# Patient Record
Sex: Female | Born: 1995 | Race: White | Hispanic: No | Marital: Single | Smoking: Never smoker
Health system: Southern US, Community
[De-identification: ages and names within clinical notes are randomized; demographics above are authoritative.]

---
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
## 2020-11-10 ENCOUNTER — Encounter (HOSPITAL_COMMUNITY): Payer: Self-pay | Admitting: Emergency Medicine

## 2020-11-10 ENCOUNTER — Emergency Department (HOSPITAL_COMMUNITY)
Admission: EM | Admit: 2020-11-10 | Discharge: 2020-11-10 | Disposition: A | Discharge status: Home / Self Care | Payer: Medicaid Other | Attending: Emergency Medicine | Admitting: Emergency Medicine

## 2020-11-10 ENCOUNTER — Other Ambulatory Visit: Payer: Self-pay

## 2020-11-10 ENCOUNTER — Emergency Department (HOSPITAL_COMMUNITY): Payer: Medicaid Other

## 2020-11-10 DIAGNOSIS — Y9389 Activity, other specified: Secondary | ICD-10-CM | POA: Diagnosis not present

## 2020-11-10 DIAGNOSIS — W548XXA Other contact with dog, initial encounter: Secondary | ICD-10-CM | POA: Insufficient documentation

## 2020-11-10 DIAGNOSIS — M79644 Pain in right finger(s): Secondary | ICD-10-CM

## 2020-11-10 DIAGNOSIS — S60931A Unspecified superficial injury of right thumb, initial encounter: Secondary | ICD-10-CM | POA: Insufficient documentation

## 2020-11-10 NOTE — ED Provider Notes (Signed)
[facility]-EMERGENCY DEPT Provider Note   CSN: [id number] Arrival date & time: 11/10/20  1357     History Chief Complaint  Patient presents with  . Hand Injury    Jonet Mathies is a 25 y.o. female with no reported past pertinent medical history.  Patient presents with chief complaint of right thumb pain.  Pain began last night after playing with her dog.  Patient reports that while playing with her dog her right thumb was "pushed back."  Patient reports that is constant, has been progressively worsening,8/10 on the pain scale.  Pain was improved with ice and is worse with movement.  Patient also endorses swelling to her right thumb.  Patient is right-hand dominant.  Patient denies any numbness or tingling to the right hand or digits.  Patient denies any bites from her dog.  HPI     History reviewed. No pertinent past medical history.  There are no problems to display for this patient.   History reviewed. No pertinent surgical history.   OB History   No obstetric history on file.     No family history on file.  Social History   Tobacco Use  . Smoking status: Never Smoker  . Smokeless tobacco: Never Used  Substance Use Topics  . Alcohol use: No  . Drug use: No    Home Medications Prior to Admission medications   Medication Sig Start Date End Date Taking? Authorizing Provider  ondansetron (ZOFRAN ODT) 4 MG disintegrating tablet 4mg  ODT q6 hours prn nausea/vomit 09/04/16   14/8/17, MD    Allergies    Patient has no known allergies.  Review of Systems   Review of Systems  Musculoskeletal: Positive for arthralgias and joint swelling. Negative for back pain and neck pain.  Skin: Negative for color change and wound.  Neurological: Negative for weakness and numbness.    Physical Exam Updated Vital Signs BP 128/79   Pulse 79   Temp 98.9 F (37.2 C)   Resp 17   LMP 11/05/2020   SpO2 97%   Physical Exam Vitals and nursing  note reviewed.  Constitutional:      General: She is not in acute distress.    Appearance: She is not ill-appearing, toxic-appearing or diaphoretic.  HENT:     Head: Normocephalic.  Eyes:     General: No scleral icterus.       Right eye: No discharge.        Left eye: No discharge.  Cardiovascular:     Rate and Rhythm: Normal rate.  Pulmonary:     Effort: Pulmonary effort is normal. No respiratory distress.  Musculoskeletal:     Right shoulder: Normal.     Right upper arm: Normal.     Right elbow: Normal.     Right forearm: Normal.     Right wrist: Normal.     Left wrist: Normal.     Right hand: Swelling (r thumb), tenderness (r thumb) and bony tenderness (r thumb) present. No deformity or lacerations. Normal range of motion. Normal strength. Normal sensation. Normal capillary refill.     Left hand: No swelling, deformity, lacerations, tenderness or bony tenderness. Normal range of motion. Normal strength. Normal sensation. Normal capillary refill.     Comments: Patient has sensation and ROM intact to all digits of right hand  Some laxity is noted to Right MCP joint   Skin:    General: Skin is warm and dry.     Comments: No  erythema, open wounds, or lacerations noted to right hand or wrist  Neurological:     General: No focal deficit present.     Mental Status: She is alert.  Psychiatric:        Behavior: Behavior is cooperative.     ED Results / Procedures / Treatments   Labs (all labs ordered are listed, but only abnormal results are displayed) Labs Reviewed - No data to display  EKG None  Radiology DG Wrist Complete Right  Result Date: 11/10/2020 CLINICAL DATA:  Right thumb hyperextension injury last night. Pain radiates inter forearm. EXAM: RIGHT HAND - COMPLETE 3+ VIEW; RIGHT THUMB 2+V; RIGHT WRIST - COMPLETE 3+ VIEW COMPARISON:  None. FINDINGS: There is no evidence of fracture or dislocation. There is no evidence of arthropathy or other focal bone abnormality.  Soft tissues are unremarkable. IMPRESSION: Negative. Electronically Signed   By: Obie Dredge M.D.   On: 11/10/2020 14:57   DG Hand Complete Right  Result Date: 11/10/2020 CLINICAL DATA:  Right thumb hyperextension injury last night. Pain radiates inter forearm. EXAM: RIGHT HAND - COMPLETE 3+ VIEW; RIGHT THUMB 2+V; RIGHT WRIST - COMPLETE 3+ VIEW COMPARISON:  None. FINDINGS: There is no evidence of fracture or dislocation. There is no evidence of arthropathy or other focal bone abnormality. Soft tissues are unremarkable. IMPRESSION: Negative. Electronically Signed   By: Obie Dredge M.D.   On: 11/10/2020 14:57   DG Finger Thumb Right  Result Date: 11/10/2020 CLINICAL DATA:  Right thumb hyperextension injury last night. Pain radiates inter forearm. EXAM: RIGHT HAND - COMPLETE 3+ VIEW; RIGHT THUMB 2+V; RIGHT WRIST - COMPLETE 3+ VIEW COMPARISON:  None. FINDINGS: There is no evidence of fracture or dislocation. There is no evidence of arthropathy or other focal bone abnormality. Soft tissues are unremarkable. IMPRESSION: Negative. Electronically Signed   By: Obie Dredge M.D.   On: 11/10/2020 14:57    Procedures Procedures   Medications Ordered in ED Medications - No data to display  ED Course  I have reviewed the triage vital signs and the nursing notes.  Pertinent labs & imaging results that were available during my care of the patient were reviewed by me and considered in my medical decision making (see chart for details).    MDM Rules/Calculators/A&P                          Alert 25 year old female in no acute distress, nontoxic appearing.  Patient presents with chief complaint of right thumb pain after injury laying with dog.  Patient reports that her son was bent backwards.  Patient is right-hand dominant.  Patient has no numbness, tingling, to her right hand or digits.  On physical exam patient has tenderness to entire length of the right thumb.  Minimal swelling noted to  right thumb.  Patient has laxity noted to right MCP joint.  Patient +3 radial pulse to right wrist.  Cap refill less than 2 in all digits of right hand.  Sensation intact to all digits of right hand.  Patient able to flex and extend all digits of right hand.    Patient was offered opiate pain medication but refused because she did not want to have any associated sedation.  Patient is tolerating her pain with no obvious discomfort at this time.  X-ray of right thumb, right hand, and right wrist obtained.  X-ray showed no fracture or dislocation.  With lacxity to right MCP concern for gamekeeper's  thumb injury.  Will place patient in a removable spica splint and have her follow-up with hand specialist Dr. Eulah Pont.  Advised patient to use OTC pain medications, keep hand elevated and to ice affected hand.  Discussed results, findings, treatment and follow up. Patient advised of return precautions. Patient verbalized understanding and agreed with plan.     Final Clinical Impression(s) / ED Diagnoses Final diagnoses:  Pain of right thumb    Rx / DC Orders ED Discharge Orders    None       Berneice Heinrich 11/10/20 1535    Derwood Kaplan, MD 11/10/20 1601

## 2020-11-10 NOTE — Progress Notes (Signed)
Orthopedic Tech Progress Note Patient Details:  Melanie Downs December 01, 1995 [id number]  Ortho Devices Ortho Device/Splint Location: applied velcro thumb spica to RUE Ortho Device/Splint Interventions: Ordered,Application   Post Interventions Patient Tolerated: Well Instructions Provided: Care of device   Jennye Moccasin 11/10/2020, 3:25 PM

## 2020-11-10 NOTE — ED Triage Notes (Signed)
Patient c/o right thumb pain after injury playing with dog today.

## 2020-11-10 NOTE — Discharge Instructions (Addendum)
You came to the emergency department today to be evaluated for your right thumb pain.  Your x-rays showed no bony mass or dislocations.  Based on your physical exam and that there is some laxity to your right thumb.  Due to this I have splint for your right hand and we will have you follow-up with the hand specialist.  Please keep your hand elevated and ice the hand to reduce swelling.    Please take Ibuprofen (Advil, motrin) and Tylenol (acetaminophen) to relieve your pain.    You may take up to 600 MG (3 pills) of normal strength ibuprofen every 8 hours as needed.   You make take tylenol, up to 1,000 mg (two extra strength pills) every 8 hours as needed.   It is safe to take ibuprofen and tylenol at the same time as they work differently.   Do not take more than 3,000 mg tylenol in a 24 hour period (not more than one dose every 8 hours.  Please check all medication labels as many medications such as pain and cold medications may contain tylenol.  Do not drink alcohol while taking these medications.  Do not take other NSAID'S while taking ibuprofen (such as aleve or naproxen).  Please take ibuprofen with food to decrease stomach upset.  Get help right away if: You have pain that gets worse. You have tingling and numbness. You have changes in skin color, including paleness or a bluish color.

## 2022-09-12 IMAGING — CR DG WRIST COMPLETE 3+V*R*
4 series · 4 of 4 positions shown · non-contrast
Comparison: None.

CLINICAL DATA: Right thumb hyperextension injury last night. Pain
radiates inter forearm.

EXAM:
RIGHT HAND - COMPLETE 3+ VIEW; RIGHT THUMB 2+V; RIGHT WRIST -
COMPLETE 3+ VIEW

[x wrist pa right]
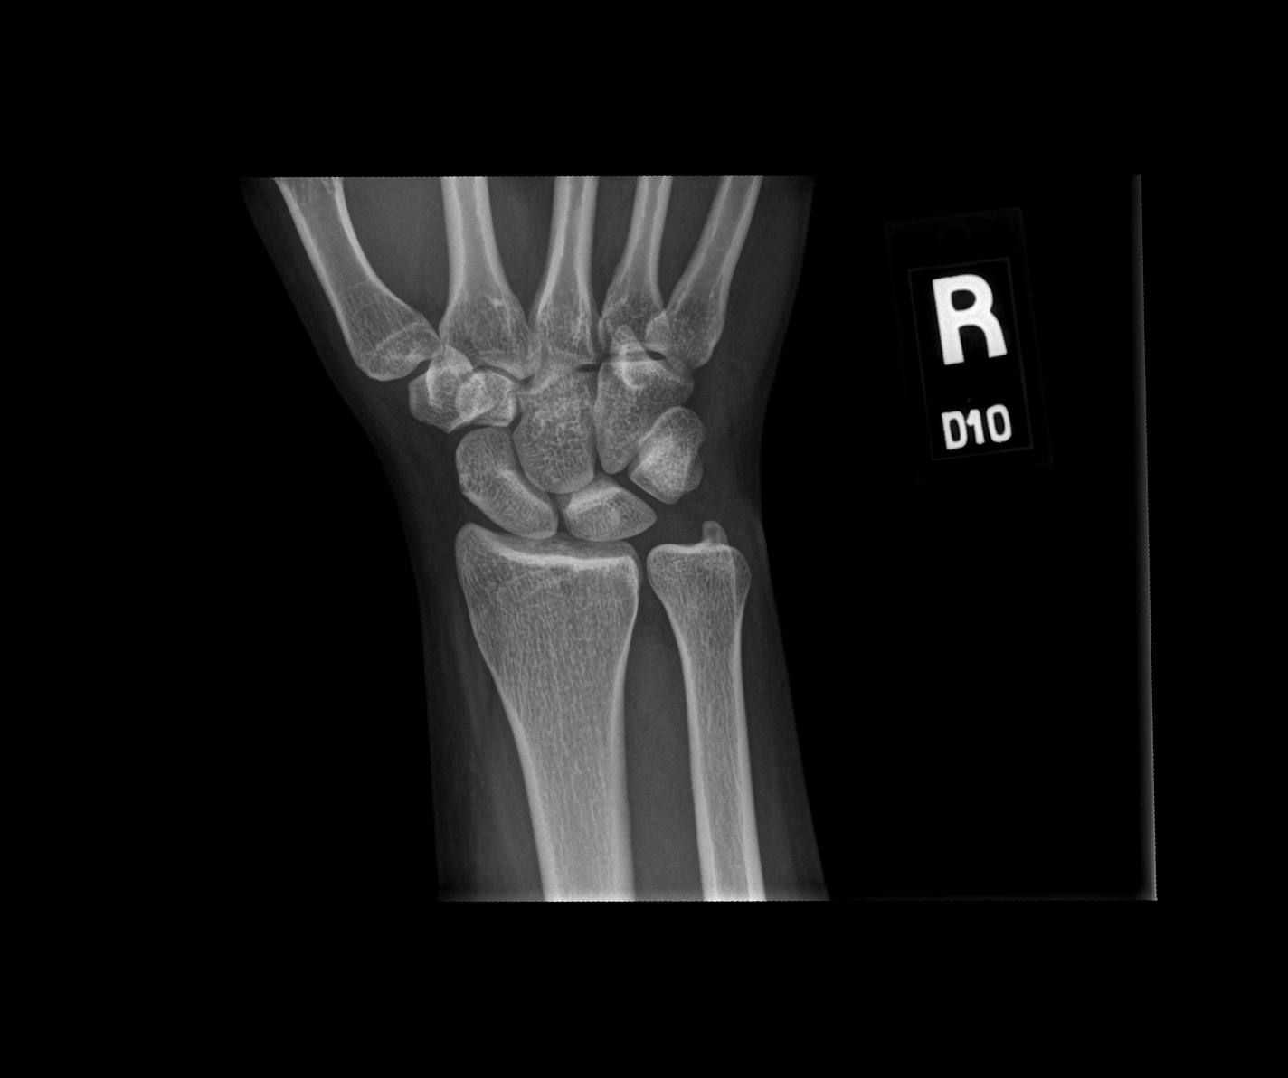

[x wrist obl right]
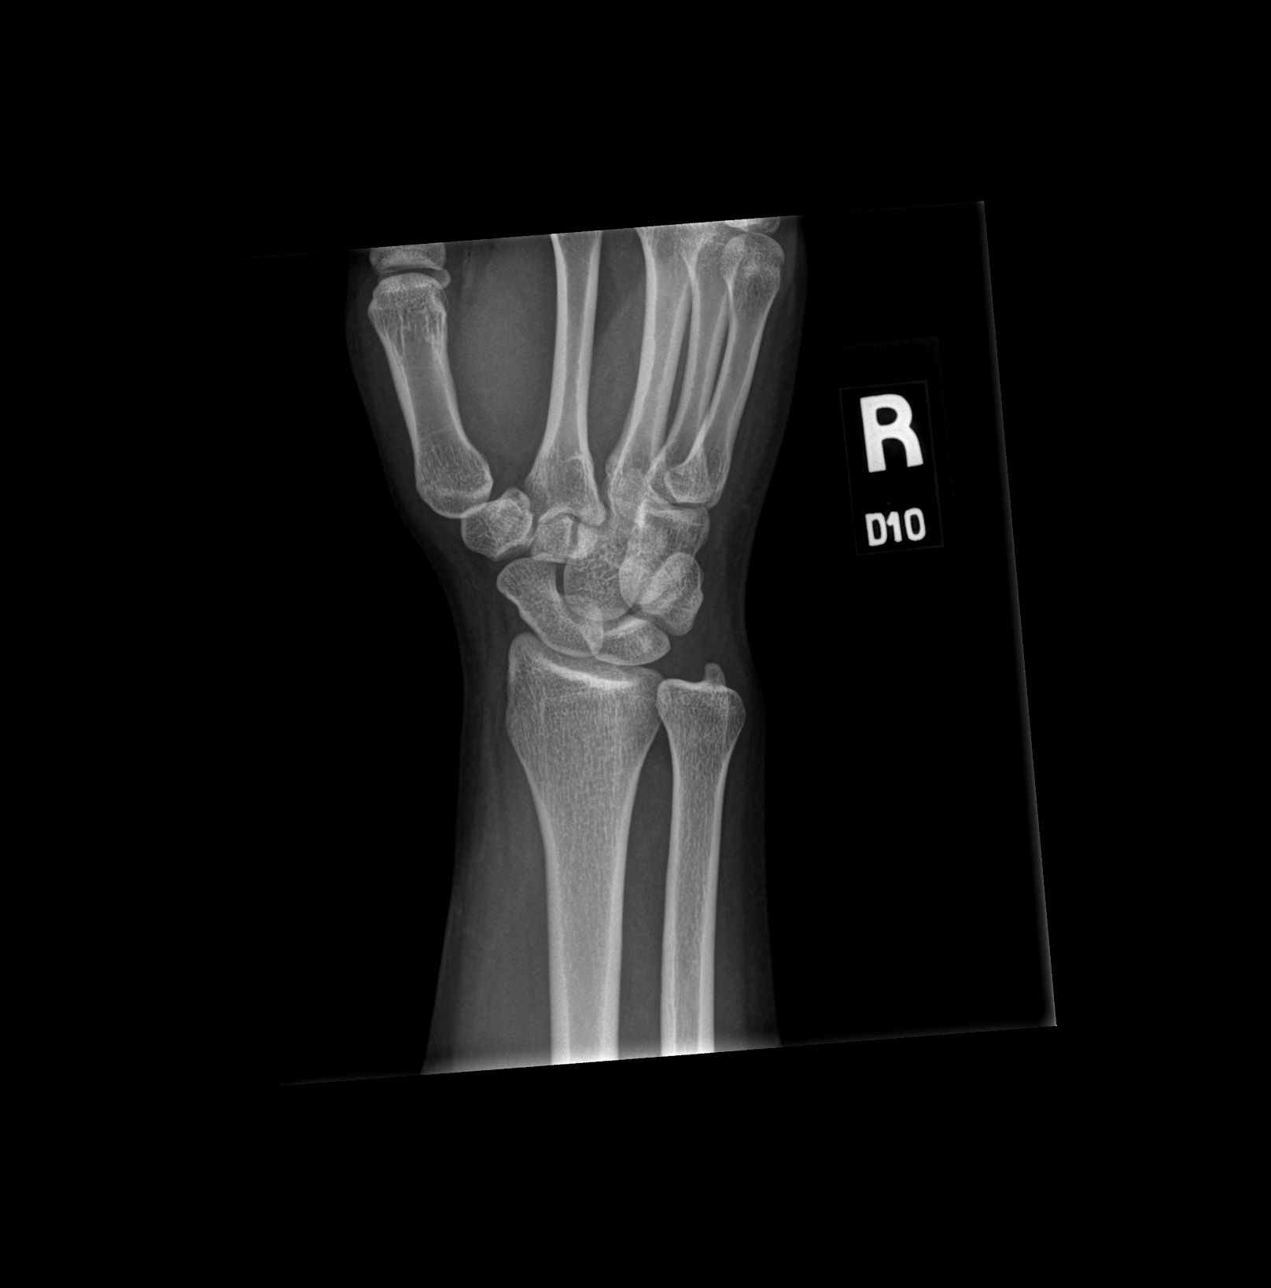

[x wrist lat right]
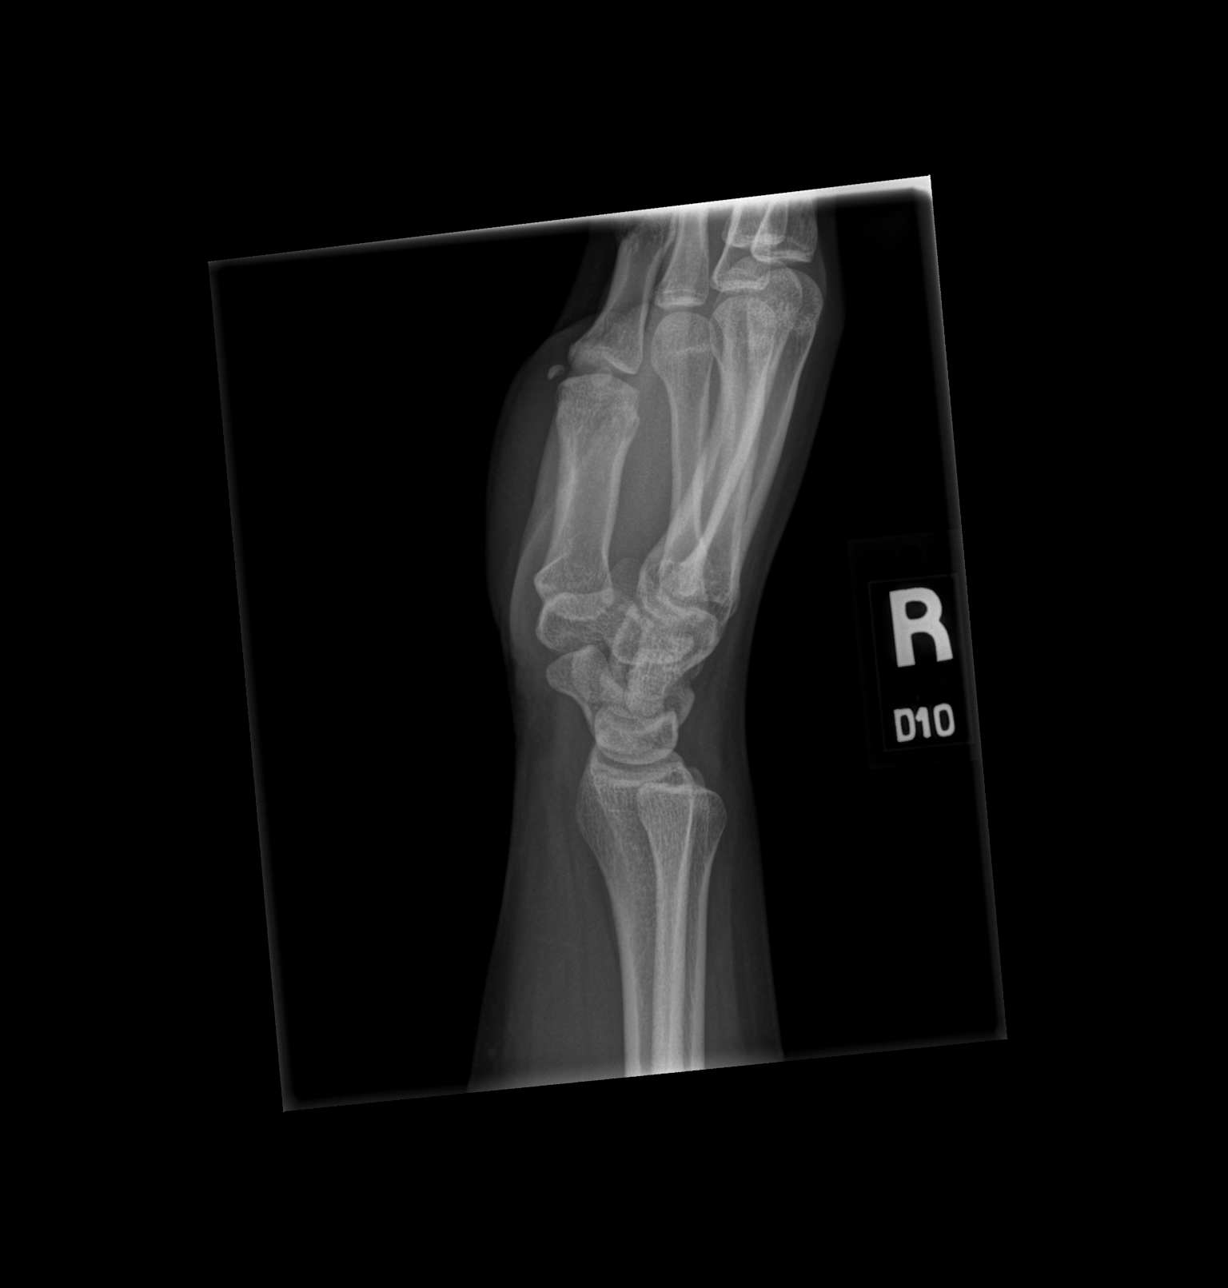

[x wrist navicular view right]
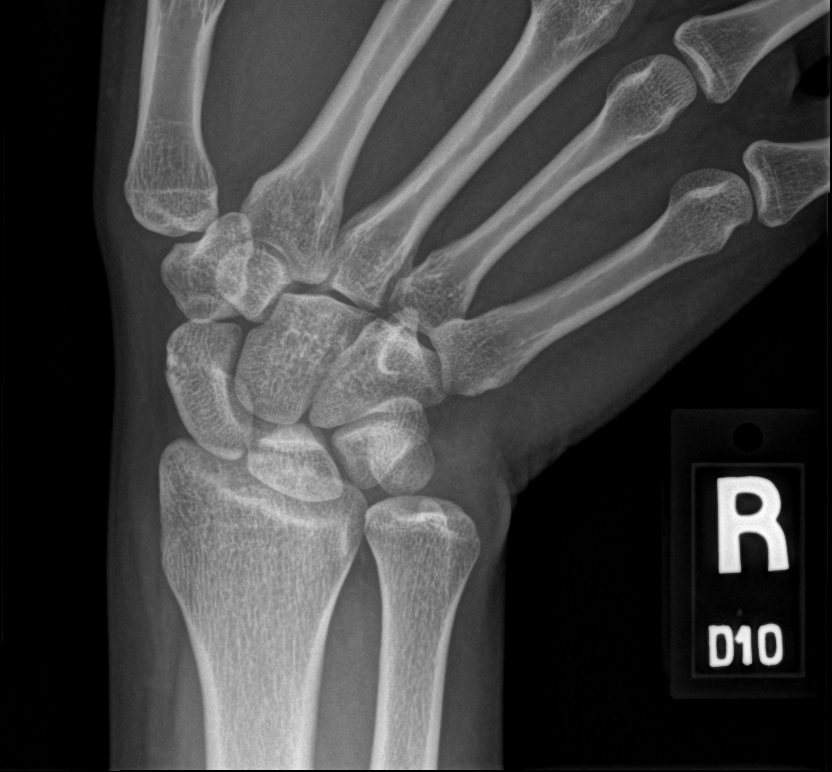

[4 of 4 positions shown; findings below may reference images not displayed]

FINDINGS: There is no evidence of fracture or dislocation. There is no
evidence of arthropathy or other focal bone abnormality. Soft
tissues are unremarkable.
IMPRESSION: Negative.
